# Patient Record
Sex: Male | Born: 2005 | Race: Asian | Hispanic: No | Marital: Single | Smoking: Never smoker
Health system: Southern US, Community
[De-identification: ages and names within clinical notes are randomized; demographics above are authoritative.]

## PROBLEM LIST (undated history)

## (undated) ENCOUNTER — Ambulatory Visit: Admission: EM | Payer: Medicaid Other

---
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
## 2023-01-04 ENCOUNTER — Telehealth: Payer: Self-pay | Admitting: Pediatrics

## 2023-01-04 DIAGNOSIS — E559 Vitamin D deficiency, unspecified: Secondary | ICD-10-CM

## 2023-01-04 MED ORDER — CHOLECALCIFEROL 125 MCG (5000 UT) PO TABS: 1.0000 | ORAL_TABLET | Freq: Every day | ORAL | 3 refills | Status: AC

## 2023-01-04 NOTE — Telephone Encounter (Signed)
Mom is calling inquiring about the lab results for this patient

## 2023-01-04 NOTE — Telephone Encounter (Signed)
Pt informed verbal understood. 

## 2023-01-04 NOTE — Telephone Encounter (Signed)
Please advise family that I have reviewed patient's lab. Patient's CBC, CMP, A1C, Folic Acid, B12 and thyroid studies have returned in the normal range. Patient's lipid profile reveals an elevated cholesterol. Patient's vitamin D is slightly low but has improved from last visit. I have sent Vitamin D  to the pharmacy. Encourage healthy diet with lean meats.

## 2023-01-04 NOTE — Telephone Encounter (Signed)
Mail the results to mom    She has requested

## 2023-01-12 IMAGING — DX DG SCOLIOSIS EVAL COMPLETE SPINE 1V
1 series · 4 of 4 positions shown · non-contrast
Comparison: None.

CLINICAL DATA: Scoliosis based on clinical exam.

EXAM:
DG SCOLIOSIS EVAL COMPLETE SPINE 1V

[Series 1: whole body ap · 0.13mm/px · 4 of 4 slices shown]
[im 1/4]
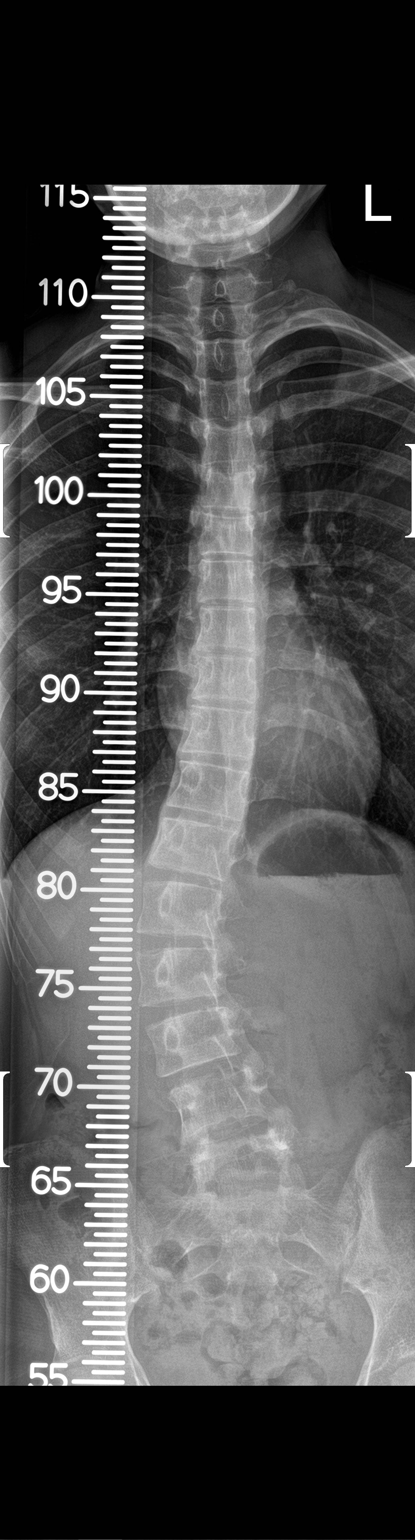
[im 2/4]
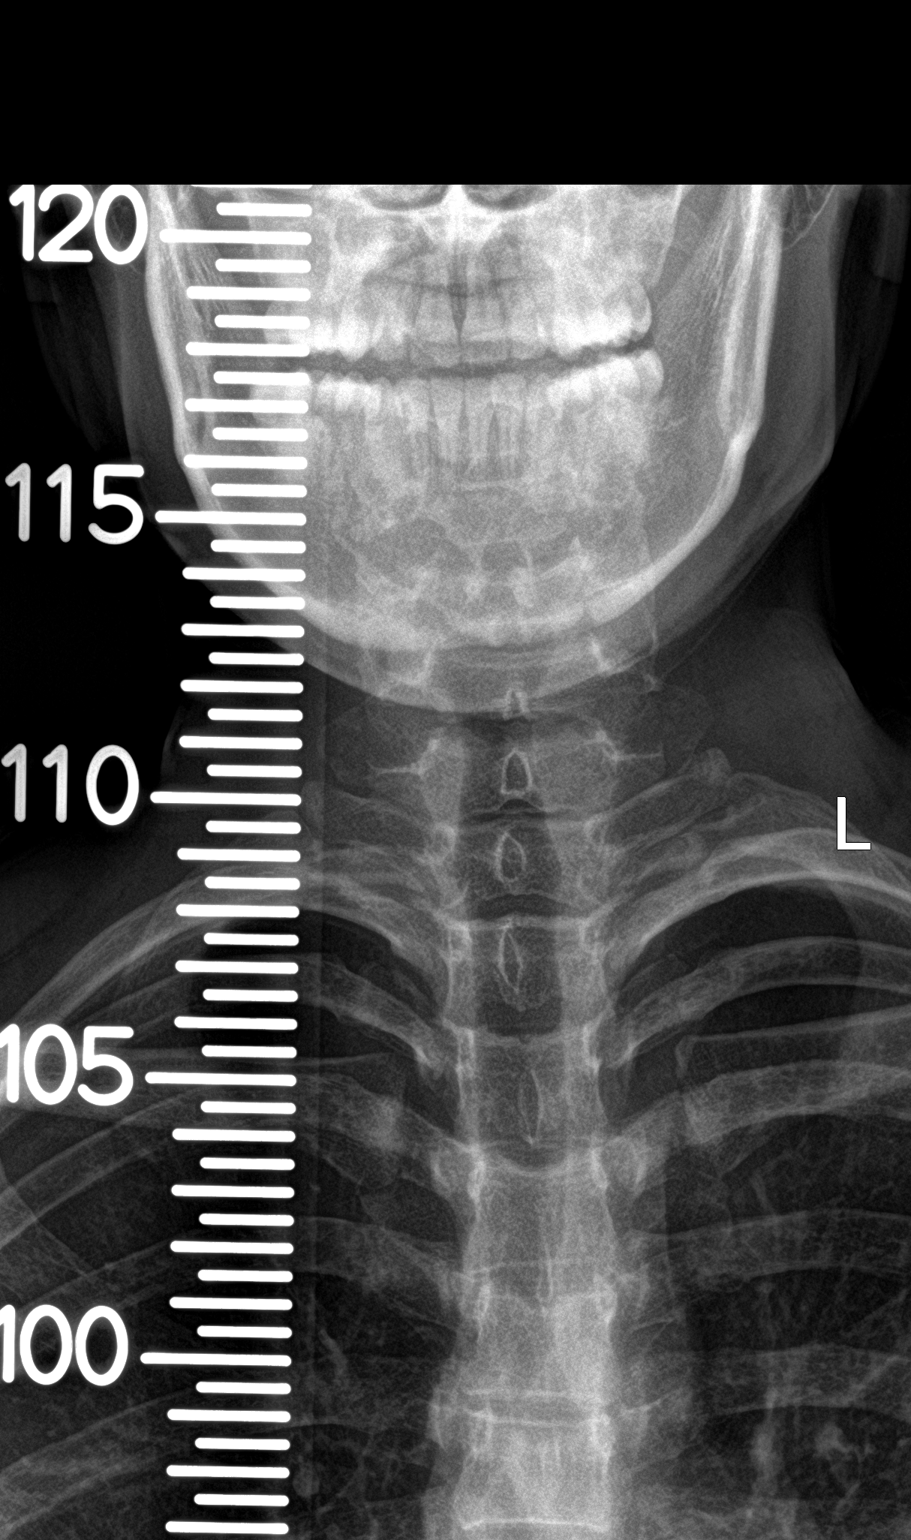
[im 3/4]
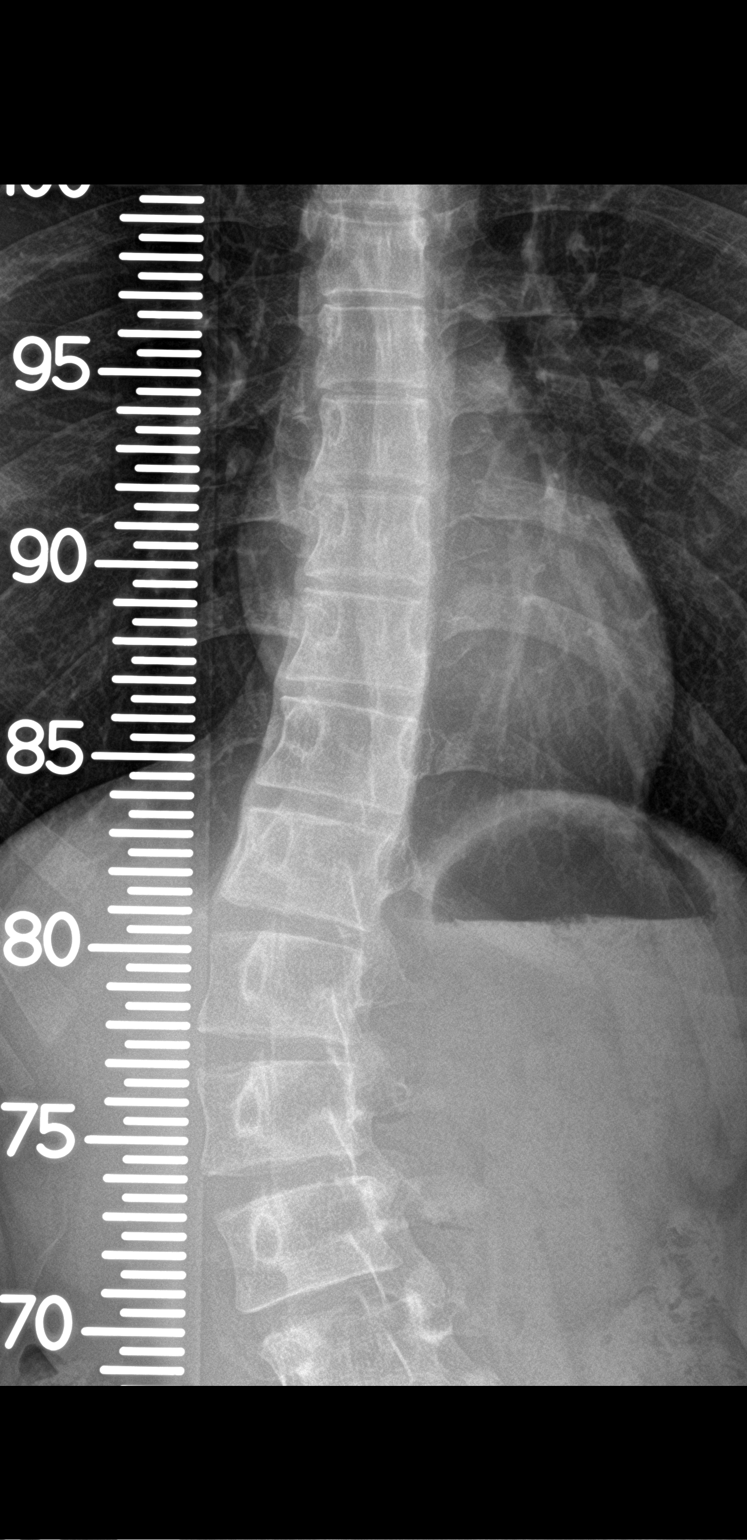
[im 4/4]
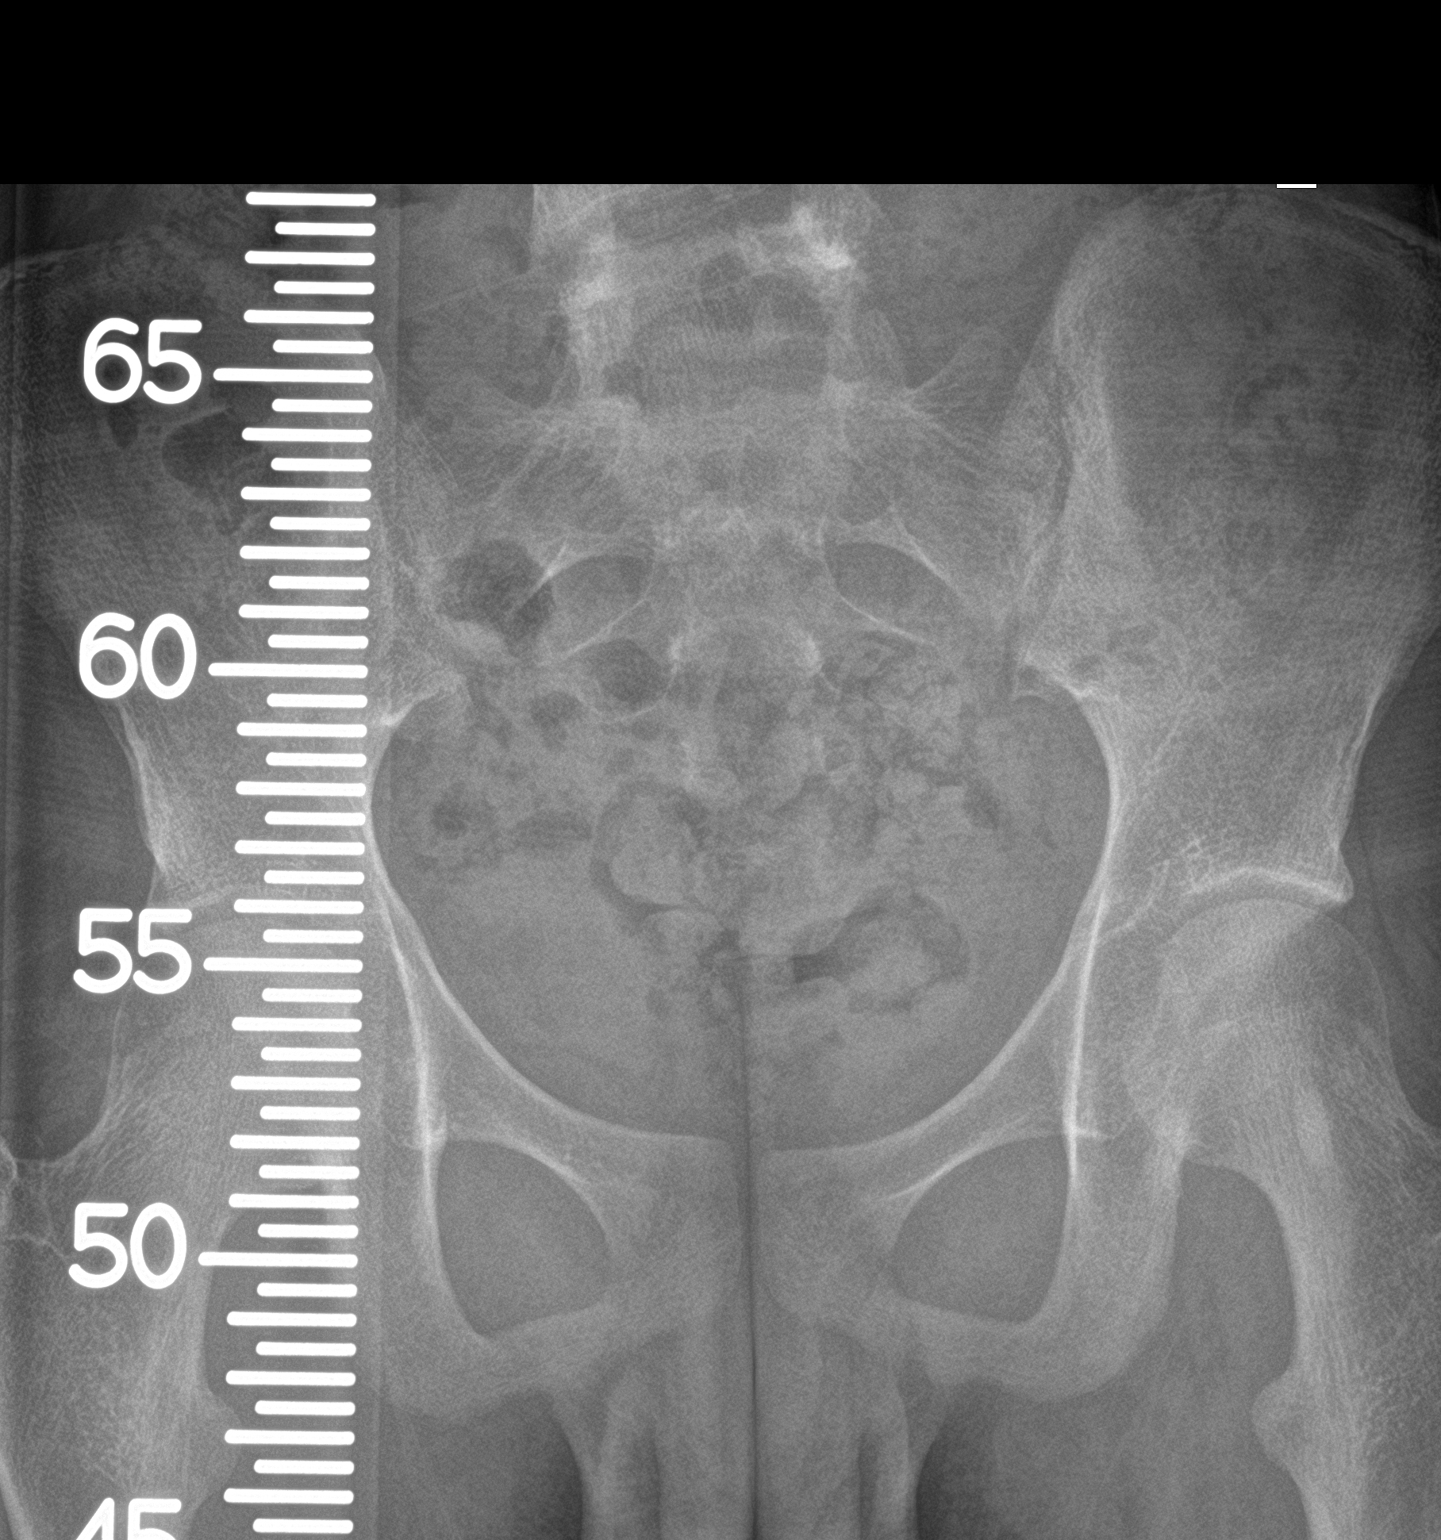

[4 of 4 positions shown; findings below may reference images not displayed]

FINDINGS: There is an a dextroscoliosis of the lumbar spine, with a rotary
component, apex at L1-L2, measuring 29 degrees in severity.

No other measurable curvature of the thoracolumbar spine.

No bone lesion or vertebral anomaly.
IMPRESSION: 1. Dextroscoliosis of the lumbar spine measuring 29 degrees in
severity.
2. No bone lesion or vertebral anomaly.

## 2023-01-21 ENCOUNTER — Encounter: Payer: Self-pay | Admitting: Pediatrics

## 2023-02-16 ENCOUNTER — Ambulatory Visit (INDEPENDENT_AMBULATORY_CARE_PROVIDER_SITE_OTHER): Payer: Medicaid Other | Admitting: Pediatrics

## 2023-02-16 ENCOUNTER — Encounter: Payer: Self-pay | Admitting: Pediatrics

## 2023-02-16 VITALS — BP 120/72 | HR 77 | Ht 64.76 in | Wt 89.0 lb

## 2023-02-16 DIAGNOSIS — Z1331 Encounter for screening for depression: Secondary | ICD-10-CM | POA: Diagnosis not present

## 2023-02-16 DIAGNOSIS — F4321 Adjustment disorder with depressed mood: Secondary | ICD-10-CM | POA: Diagnosis not present

## 2023-02-16 DIAGNOSIS — R636 Underweight: Secondary | ICD-10-CM | POA: Diagnosis not present

## 2023-02-16 NOTE — Progress Notes (Signed)
Patient Name:  Javier Diaz Date of Birth:  [birth year] Age:  17 y.o. Date of Visit:  02/16/2023   Accompanied by:  Mother Debarah Crape vi. Patient and mother are historians during today's visit.  Interpreter:  none  Subjective:    Javier Diaz  is a 17 y.o. who presents for weight check and recheck of behavior.   Patient states that he has been active this summer, walking at least 2 miles daily. Mother gives child eggs/fruit for breakfast, a variety of items for lunch and dinner including pasta, rice and chicken, tacos, sandwiches and protein shakes. Patient has lost 1.5 lbs since last visit. Overall, patient's bloodwork returned in the normal range, with the exception to mild vitamin D deficiency and hypercholesteremia.   Reviewed PHQ-9 with patient. Patient states that he does not have any suicidal or homicidal ideations. Patient has looked into online counselors for online counseling sessions. Patient returns to [employer] [facility] in 3 weeks. Patient has not reached out to his advisor yet. Patient will return to living in the dormitory with a friend.      02/16/2023    9:30 AM 12/24/2022    9:30 AM 07/17/2021    8:21 AM 01/09/2020    9:26 AM  Depression screen PHQ 2/9  Decreased Interest 1 0 0 0  Down, Depressed, Hopeless 1 0 0 0  PHQ - 2 Score 2 0 0 0  Altered sleeping 0 0 0 0  Tired, decreased energy 2 0 0 0  Change in appetite 0 0 0 1  Feeling bad or failure about yourself  2 0 0 0  Trouble concentrating 0 0 0 0  Moving slowly or fidgety/restless 0 0 0 1  Suicidal thoughts 0 0    PHQ-9 Score 6 0 0 2  Difficult doing work/chores  Not difficult at all     Past Medical History:  Diagnosis Date   Acne vulgaris 01/09/2020   Underweight 01/09/2020     History reviewed. No pertinent surgical history.   Family History  Problem Relation Age of Onset   Healthy Mother     Current Meds  Medication Sig   brompheniramine-pseudoephedrine-DM 30-2-10 MG/5ML syrup Take 5 mLs by mouth 4 (four) times daily  as needed.   Cholecalciferol 125 MCG (5000 UT) TABS Take 1 tablet (5,000 Units total) by mouth daily.   VITAMIN D PO Take by mouth.       Allergies  Allergen Reactions   Other Other (See Comments)    Cat/dog hair Watery, red eyes and itching    Review of Systems  Constitutional: Negative.  Negative for fever.  HENT: Negative.    Eyes: Negative.  Negative for pain.  Respiratory: Negative.  Negative for cough and shortness of breath.   Cardiovascular: Negative.  Negative for chest pain and palpitations.  Gastrointestinal: Negative.  Negative for abdominal pain, diarrhea and vomiting.  Genitourinary: Negative.   Musculoskeletal: Negative.  Negative for joint pain.  Skin: Negative.  Negative for rash.  Neurological: Negative.  Negative for weakness and headaches.     Objective:   Blood pressure 120/72, pulse 77, height 5' 4.76" (1.645 m), weight 89 lb (40.4 kg), SpO2 96%.  Physical Exam Constitutional:      General: He is not in acute distress.    Appearance: Normal appearance.  HENT:     Head: Normocephalic and atraumatic.     Mouth/Throat:     Mouth: Mucous membranes are moist.  Eyes:     Conjunctiva/sclera: Conjunctivae  normal.  Cardiovascular:     Rate and Rhythm: Normal rate.  Pulmonary:     Effort: Pulmonary effort is normal.  Musculoskeletal:        General: Normal range of motion.     Cervical back: Normal range of motion.  Skin:    General: Skin is warm.  Neurological:     General: No focal deficit present.     Mental Status: He is alert and oriented to person, place, and time.     Gait: Gait is intact.  Psychiatric:        Mood and Affect: Mood and affect normal.        Behavior: Behavior normal.      IN-HOUSE Laboratory Results:    No results found for any visits on 02/16/23.   Assessment:    Underweight  Adjustment disorder with depressed mood  Screening for depression  Plan:   Continue with multivitamin and protein rich diet. Discussed  getting protein shakes for patient to take back to school with him.  Will recheck in December.   Advised patient to find a Veterinary surgeon (online or in-person) at school to talk with in addition to reaching out to his advisor. It is important for child to have someone to talk to in regards to any difficulties at school.

## 2023-03-02 ENCOUNTER — Encounter: Payer: Self-pay | Admitting: Emergency Medicine

## 2023-03-02 ENCOUNTER — Ambulatory Visit
Admission: EM | Admit: 2023-03-02 | Discharge: 2023-03-02 | Disposition: A | Discharge status: Home / Self Care | Payer: Medicaid Other | Attending: Nurse Practitioner | Admitting: Nurse Practitioner

## 2023-03-02 DIAGNOSIS — J069 Acute upper respiratory infection, unspecified: Secondary | ICD-10-CM | POA: Insufficient documentation

## 2023-03-02 DIAGNOSIS — J029 Acute pharyngitis, unspecified: Secondary | ICD-10-CM | POA: Diagnosis not present

## 2023-03-02 DIAGNOSIS — Z1152 Encounter for screening for COVID-19: Secondary | ICD-10-CM | POA: Diagnosis not present

## 2023-03-02 LAB — POCT RAPID STREP A (OFFICE): Rapid Strep A Screen: NEGATIVE

## 2023-03-02 MED ORDER — FLUTICASONE PROPIONATE 50 MCG/ACT NA SUSP: 2.0000 | Freq: Every day | NASAL | 0 refills | Status: AC

## 2023-03-02 MED ORDER — PSEUDOEPH-BROMPHEN-DM 30-2-10 MG/5ML PO SYRP: 5.0000 mL | ORAL_SOLUTION | Freq: Four times a day (QID) | ORAL | 0 refills | Status: AC | PRN

## 2023-03-02 NOTE — ED Triage Notes (Signed)
Headache and productive cough with yellow sputum x 3 days

## 2023-03-02 NOTE — Discharge Instructions (Addendum)
The rapid strep test was negative.  A throat culture and COVID test are pending.  You will be contacted if the pending test results are positive. Take medication as prescribed. Increase fluids and allow for plenty of rest. May take over-the-counter Tylenol or ibuprofen as needed for pain, fever, or general discomfort. Warm salt water gargles 3-4 times daily as needed for throat pain or discomfort. Normal saline nasal spray as recommended throughout the day to help with nasal congestion. Recommend using a humidifier in the bedroom at nighttime during sleep and sleeping elevated on pillows while cough symptoms persist. If symptoms are not improving over the next 7 to 10 days, or suddenly worsen, please follow-up in this clinic or with your primary care physician for further evaluation. Follow-up as needed.

## 2023-03-02 NOTE — ED Provider Notes (Signed)
[facility]    CSN: [id number] Arrival date & time: 03/02/23  0800      History   Chief Complaint No chief complaint on file.   HPI Javier Diaz is a 17 y.o. male.   The history is provided by the patient.   The patient presents with a 3-day history of headache, sore throat, nasal congestion, and productive cough.  Patient denies fever, chills, ear pain, ear drainage, wheezing, shortness of breath, difficulty breathing, chest pain, abdominal pain, nausea, vomiting, or diarrhea.  Patient reports he has been taking over-the-counter cough and cold medication.  Patient denies any obvious known sick contacts, asthma or seasonal allergies.  Past Medical History:  Diagnosis Date   Acne vulgaris 01/09/2020   Underweight 01/09/2020    Patient Active Problem List   Diagnosis Date Noted   Underweight 01/09/2020   Acne vulgaris 01/09/2020    History reviewed. No pertinent surgical history.     Home Medications    Prior to Admission medications   Medication Sig Start Date End Date Taking? Authorizing Provider  brompheniramine-pseudoephedrine-DM 30-2-10 MG/5ML syrup Take 5 mLs by mouth 4 (four) times daily as needed. 03/02/23  Yes Jaben Benegas-Warren, Sadie Haber, NP  fluticasone (FLONASE) 50 MCG/ACT nasal spray Place 2 sprays into both nostrils daily. 03/02/23  Yes Claudina Oliphant-Warren, Sadie Haber, NP  Cholecalciferol 125 MCG (5000 UT) TABS Take 1 tablet (5,000 Units total) by mouth daily. 01/04/23 04/04/23  Vella Kohler, MD  VITAMIN D PO Take by mouth.    [provider]    Family History Family History  Problem Relation Age of Onset   Healthy Mother     Social History Social History   Tobacco Use   Smoking status: Never   Smokeless tobacco: Never  Vaping Use   Vaping status: Never Used  Substance Use Topics   Alcohol use: Never   Drug use: Never     Allergies   Other   Review of Systems Review of Systems Per HPI  Physical Exam Triage Vital  Signs ED Triage Vitals  Encounter Vitals Group     BP 03/02/23 0813 132/81     Systolic BP Percentile --      Diastolic BP Percentile --      Pulse Rate 03/02/23 0813 87     Resp 03/02/23 0813 16     Temp 03/02/23 0813 98.1 F (36.7 C)     Temp Source 03/02/23 0813 Oral     SpO2 03/02/23 0813 97 %     Weight --      Height --      Head Circumference --      Peak Flow --      Pain Score 03/02/23 0814 3     Pain Loc --      Pain Education --      Exclude from Growth Chart --    No data found.  Updated Vital Signs BP 132/81 (BP Location: Right Arm)   Pulse 87   Temp 98.1 F (36.7 C) (Oral)   Resp 16   SpO2 97%   Visual Acuity Right Eye Distance:   Left Eye Distance:   Bilateral Distance:    Right Eye Near:   Left Eye Near:    Bilateral Near:     Physical Exam Vitals and nursing note reviewed.  Constitutional:      General: He is not in acute distress.    Appearance: Normal appearance.  HENT:     Head:  Normocephalic.     Right Ear: Tympanic membrane, ear canal and external ear normal.     Left Ear: Tympanic membrane, ear canal and external ear normal.     Nose: Congestion present.     Mouth/Throat:     Lips: Pink.     Mouth: Mucous membranes are moist.     Pharynx: Oropharynx is clear. Uvula midline. Posterior oropharyngeal erythema and postnasal drip present. No pharyngeal swelling.     Tonsils: No tonsillar exudate or tonsillar abscesses.  Eyes:     Extraocular Movements: Extraocular movements intact.     Conjunctiva/sclera: Conjunctivae normal.     Pupils: Pupils are equal, round, and reactive to light.  Cardiovascular:     Rate and Rhythm: Normal rate and regular rhythm.     Pulses: Normal pulses.     Heart sounds: Normal heart sounds.  Pulmonary:     Effort: Pulmonary effort is normal. No respiratory distress.     Breath sounds: Normal breath sounds. No stridor. No wheezing, rhonchi or rales.  Abdominal:     General: Bowel sounds are normal.      Palpations: Abdomen is soft.     Tenderness: There is no abdominal tenderness.  Musculoskeletal:     Cervical back: Normal range of motion.  Lymphadenopathy:     Cervical: No cervical adenopathy.  Skin:    General: Skin is warm and dry.  Neurological:     General: No focal deficit present.     Mental Status: He is alert and oriented to person, place, and time.  Psychiatric:        Mood and Affect: Mood normal.        Behavior: Behavior normal.      UC Treatments / Results  Labs (all labs ordered are listed, but only abnormal results are displayed) Labs Reviewed  SARS CORONAVIRUS 2 (TAT 6-24 HRS)  CULTURE, GROUP A STREP [facility])  POCT RAPID STREP A (OFFICE)    EKG   Radiology No results found.  Procedures Procedures (including critical care time)  Medications Ordered in UC Medications - No data to display  Initial Impression / Assessment and Plan / UC Course  I have reviewed the triage vital signs and the nursing notes.  Pertinent labs & imaging results that were available during my care of the patient were reviewed by me and considered in my medical decision making (see chart for details).  The patient is well-appearing, he is in no acute distress, vital signs are stable.  Rapid strep test is negative, throat culture and COVID test are pending.  Patient is able to receive Paxlovid if his COVID test is positive.  Differential diagnoses include COVID, viral upper respiratory infection with cough, and acute pharyngitis.  Will treat patient symptomatically with Bromfed-DM for his cough, and fluticasone 50 micro nasal spray for his nasal congestion.  Supportive care recommendations were provided and discussed with the patient to include increasing fluids, allowing for plenty of rest, and use of a humidifier in his bedroom at nighttime during sleep.  Patient was advised to follow-up if symptoms worsen, or do not improve over the next 7 to 10 days.  Patient is in agreement with  this plan of care and verbalizes understanding.  All questions were answered.  Patient stable for discharge.  Final Clinical Impressions(s) / UC Diagnoses   Final diagnoses:  Viral upper respiratory tract infection with cough  Encounter for screening for COVID-19  Sore throat     Discharge Instructions  The rapid strep test was negative.  A throat culture and COVID test are pending.  You will be contacted if the pending test results are positive. Take medication as prescribed. Increase fluids and allow for plenty of rest. May take over-the-counter Tylenol or ibuprofen as needed for pain, fever, or general discomfort. Warm salt water gargles 3-4 times daily as needed for throat pain or discomfort. Normal saline nasal spray as recommended throughout the day to help with nasal congestion. Recommend using a humidifier in the bedroom at nighttime during sleep and sleeping elevated on pillows while cough symptoms persist. If symptoms are not improving over the next 7 to 10 days, or suddenly worsen, please follow-up in this clinic or with your primary care physician for further evaluation. Follow-up as needed.     ED Prescriptions     Medication Sig Dispense Auth. Provider   brompheniramine-pseudoephedrine-DM 30-2-10 MG/5ML syrup Take 5 mLs by mouth 4 (four) times daily as needed. 140 mL Mally Gavina-Warren, Sadie Haber, NP   fluticasone (FLONASE) 50 MCG/ACT nasal spray Place 2 sprays into both nostrils daily. 16 g Quynh Basso-Warren, Sadie Haber, NP      PDMP not reviewed this encounter.   Abran Cantor, NP 03/02/23 [phone]

## 2023-03-04 ENCOUNTER — Telehealth: Payer: Self-pay | Admitting: Emergency Medicine

## 2023-03-04 MED ORDER — PAXLOVID (300/100) 20 X 150 MG & 10 X 100MG PO TBPK: 3.0000 | ORAL_TABLET | Freq: Two times a day (BID) | ORAL | 0 refills | Status: AC

## 2023-03-04 NOTE — Telephone Encounter (Signed)
Pt mother presents inquiring about antiviral for pt due to positive covid result. Consulted PA and reported would electronically send in px for antiviral.

## 2023-03-04 NOTE — Telephone Encounter (Signed)
Paxlovid sent

## 2023-07-16 ENCOUNTER — Ambulatory Visit: Payer: Medicaid Other | Admitting: Pediatrics

## 2023-07-16 ENCOUNTER — Encounter: Payer: Self-pay | Admitting: Pediatrics

## 2023-07-16 VITALS — BP 110/65 | HR 89 | Ht 65.51 in | Wt 96.0 lb

## 2023-07-16 DIAGNOSIS — R636 Underweight: Secondary | ICD-10-CM

## 2023-07-16 DIAGNOSIS — F4321 Adjustment disorder with depressed mood: Secondary | ICD-10-CM | POA: Diagnosis not present

## 2023-07-16 DIAGNOSIS — Z1331 Encounter for screening for depression: Secondary | ICD-10-CM | POA: Diagnosis not present

## 2023-07-16 DIAGNOSIS — Z23 Encounter for immunization: Secondary | ICD-10-CM

## 2023-07-16 NOTE — Progress Notes (Signed)
Patient Name:  Javier Diaz Date of Birth:  04/30/2006 Age:  17 y.o. Date of Visit:  07/16/2023   Accompanied by:  Mother Javier Diaz. Patient and mother are historians during today's visit.  Interpreter:  none  Subjective:    Javier Diaz  is a 17 y.o. who presents for recheck weight and and behavior. Patient has gained weight from last visit. Patient has learned to cook chicken and beef, and will eat it with bread or rice. Patient is planning to cook when he returns to school. Patient notes that he has started working out at a gym and talks with a Veterinary surgeon for 30 minutes/week via [employer]. PHQ9 reviewed with patient. Patient denies any suicidal or homicidal ideations.      07/16/2023   10:19 AM 02/16/2023    9:30 AM 12/24/2022    9:30 AM 07/17/2021    8:21 AM 01/09/2020    9:26 AM  Depression screen PHQ 2/9  Decreased Interest 0 1 0 0 0  Down, Depressed, Hopeless 0 1 0 0 0  PHQ - 2 Score 0 2 0 0 0  Altered sleeping 0 0 0 0 0  Tired, decreased energy 0 2 0 0 0  Change in appetite 0 0 0 0 1  Feeling bad or failure about yourself  0 2 0 0 0  Trouble concentrating 0 0 0 0 0  Moving slowly or fidgety/restless 0 0 0 0 1  Suicidal thoughts 0 0 0    PHQ-9 Score 0 6 0 0 2  Difficult doing work/chores Not difficult at all  Not difficult at all      Past Medical History:  Diagnosis Date   Acne vulgaris 01/09/2020   Underweight 01/09/2020     History reviewed. No pertinent surgical history.   Family History  Problem Relation Age of Onset   Healthy Mother     Current Meds  Medication Sig   brompheniramine-pseudoephedrine-DM 30-2-10 MG/5ML syrup Take 5 mLs by mouth 4 (four) times daily as needed.   fluticasone (FLONASE) 50 MCG/ACT nasal spray Place 2 sprays into both nostrils daily.   VITAMIN D PO Take by mouth.       Allergies  Allergen Reactions   Other Other (See Comments)    Cat/dog hair Watery, red eyes and itching    Review of Systems  Constitutional: Negative.  Negative  for fever.  HENT: Negative.    Eyes: Negative.  Negative for pain.  Respiratory: Negative.  Negative for cough and shortness of breath.   Cardiovascular: Negative.  Negative for chest pain and palpitations.  Gastrointestinal: Negative.  Negative for abdominal pain, diarrhea and vomiting.  Genitourinary: Negative.   Musculoskeletal: Negative.  Negative for joint pain.  Skin: Negative.  Negative for rash.  Neurological: Negative.  Negative for weakness and headaches.     Objective:   Blood pressure 110/65, pulse 89, height 5' 5.51" (1.664 m), weight 96 lb (43.5 kg), SpO2 98%.  Physical Exam Constitutional:      General: He is not in acute distress.    Appearance: Normal appearance.  HENT:     Head: Normocephalic and atraumatic.     Mouth/Throat:     Mouth: Mucous membranes are moist.  Eyes:     Conjunctiva/sclera: Conjunctivae normal.  Cardiovascular:     Rate and Rhythm: Normal rate.  Pulmonary:     Effort: Pulmonary effort is normal.  Musculoskeletal:        General: Normal range of motion.  Cervical back: Normal range of motion.  Skin:    General: Skin is warm.  Neurological:     General: No focal deficit present.     Mental Status: He is alert and oriented to person, place, and time.     Gait: Gait is intact.  Psychiatric:        Mood and Affect: Mood and affect normal.        Behavior: Behavior normal.      IN-HOUSE Laboratory Results:    No results found for any visits on 07/16/23.   Assessment:    Underweight  Adjustment disorder with depressed mood  Screening for depression  Need for vaccination - Plan: Flu vaccine trivalent PF, 6mos and older(Flulaval,Afluria,Fluarix,Fluzone)  Plan:   Continue with protein rich diet. Patient to follow up with a PCP at college.   Continue with counseling sessions as discussed.   Handout (VIS) provided for each vaccine at this visit. Questions were answered. Parent verbally expressed understanding and also  agreed with the administration of vaccine/vaccines as ordered above today.  Orders Placed This Encounter  Procedures   Flu vaccine trivalent PF, 6mos and older(Flulaval,Afluria,Fluarix,Fluzone)
